# Patient Record
Sex: Female | Born: 1960 | Race: White | Hispanic: No | Marital: Married | Smoking: Former smoker
Health system: Southern US, Community
[De-identification: ages and names within clinical notes are randomized; demographics above are authoritative.]

## PROBLEM LIST (undated history)

## (undated) DIAGNOSIS — F32A Depression, unspecified: Secondary | ICD-10-CM

## (undated) DIAGNOSIS — F909 Attention-deficit hyperactivity disorder, unspecified type: Secondary | ICD-10-CM

## (undated) DIAGNOSIS — F329 Major depressive disorder, single episode, unspecified: Secondary | ICD-10-CM

## (undated) DIAGNOSIS — F419 Anxiety disorder, unspecified: Secondary | ICD-10-CM

## (undated) HISTORY — DX: Anxiety disorder, unspecified: F41.9

## (undated) HISTORY — DX: Major depressive disorder, single episode, unspecified: F32.9

## (undated) HISTORY — DX: Attention-deficit hyperactivity disorder, unspecified type: F90.9

## (undated) HISTORY — DX: Depression, unspecified: F32.A

---

## 2005-11-24 ENCOUNTER — Ambulatory Visit: Payer: Self-pay | Admitting: *Deleted

## 2005-11-24 ENCOUNTER — Ambulatory Visit (HOSPITAL_COMMUNITY): Payer: Self-pay | Admitting: *Deleted

## 2005-12-08 ENCOUNTER — Ambulatory Visit (HOSPITAL_COMMUNITY): Payer: Self-pay | Admitting: *Deleted

## 2006-01-26 ENCOUNTER — Ambulatory Visit (HOSPITAL_COMMUNITY): Payer: Self-pay | Admitting: *Deleted

## 2006-04-18 ENCOUNTER — Ambulatory Visit (HOSPITAL_COMMUNITY): Payer: Self-pay | Admitting: Psychiatry

## 2006-07-18 ENCOUNTER — Ambulatory Visit (HOSPITAL_COMMUNITY): Payer: Self-pay | Admitting: Psychiatry

## 2006-09-05 ENCOUNTER — Ambulatory Visit (HOSPITAL_COMMUNITY): Payer: Self-pay | Admitting: Psychiatry

## 2006-10-31 ENCOUNTER — Ambulatory Visit (HOSPITAL_COMMUNITY): Payer: Self-pay | Admitting: Psychiatry

## 2007-01-30 ENCOUNTER — Ambulatory Visit (HOSPITAL_COMMUNITY): Payer: Self-pay | Admitting: Psychiatry

## 2007-05-01 ENCOUNTER — Ambulatory Visit (HOSPITAL_COMMUNITY): Payer: Self-pay | Admitting: Psychiatry

## 2007-07-19 ENCOUNTER — Ambulatory Visit (HOSPITAL_COMMUNITY): Payer: Self-pay | Admitting: Psychiatry

## 2007-10-04 ENCOUNTER — Ambulatory Visit (HOSPITAL_COMMUNITY): Payer: Self-pay | Admitting: Psychiatry

## 2007-10-31 ENCOUNTER — Ambulatory Visit (HOSPITAL_COMMUNITY): Payer: Self-pay | Admitting: Psychiatry

## 2007-11-25 ENCOUNTER — Ambulatory Visit (HOSPITAL_COMMUNITY): Payer: Self-pay | Admitting: Psychiatry

## 2008-01-06 ENCOUNTER — Ambulatory Visit (HOSPITAL_COMMUNITY): Payer: Self-pay | Admitting: Psychiatry

## 2008-02-05 ENCOUNTER — Ambulatory Visit (HOSPITAL_COMMUNITY): Payer: Self-pay | Admitting: Psychiatry

## 2008-03-18 ENCOUNTER — Ambulatory Visit (HOSPITAL_COMMUNITY): Payer: Self-pay | Admitting: Psychiatry

## 2008-05-19 ENCOUNTER — Ambulatory Visit (HOSPITAL_COMMUNITY): Payer: Self-pay | Admitting: Psychiatry

## 2008-05-21 ENCOUNTER — Ambulatory Visit (HOSPITAL_COMMUNITY): Payer: Self-pay | Admitting: Psychiatry

## 2008-05-28 ENCOUNTER — Ambulatory Visit (HOSPITAL_COMMUNITY): Payer: Self-pay | Admitting: Psychiatry

## 2008-06-11 ENCOUNTER — Ambulatory Visit (HOSPITAL_COMMUNITY): Payer: Self-pay | Admitting: Psychiatry

## 2008-06-29 ENCOUNTER — Ambulatory Visit (HOSPITAL_COMMUNITY): Payer: Self-pay | Admitting: Psychiatry

## 2008-08-09 ENCOUNTER — Emergency Department (HOSPITAL_BASED_OUTPATIENT_CLINIC_OR_DEPARTMENT_OTHER): Admission: EM | Admit: 2008-08-09 | Discharge: 2008-08-09 | Payer: Self-pay | Admitting: Emergency Medicine

## 2008-09-08 ENCOUNTER — Ambulatory Visit (HOSPITAL_COMMUNITY): Payer: Self-pay | Admitting: Psychiatry

## 2008-10-27 ENCOUNTER — Ambulatory Visit (HOSPITAL_COMMUNITY): Payer: Self-pay | Admitting: Psychiatry

## 2008-12-08 ENCOUNTER — Ambulatory Visit (HOSPITAL_COMMUNITY): Payer: Self-pay | Admitting: Psychiatry

## 2009-02-03 ENCOUNTER — Ambulatory Visit (HOSPITAL_COMMUNITY): Payer: Self-pay | Admitting: Psychiatry

## 2009-04-28 ENCOUNTER — Ambulatory Visit (HOSPITAL_COMMUNITY): Payer: Self-pay | Admitting: Psychiatry

## 2009-08-10 ENCOUNTER — Ambulatory Visit (HOSPITAL_COMMUNITY): Payer: Self-pay | Admitting: Psychiatry

## 2009-08-10 IMAGING — CR DG ABDOMEN ACUTE W/ 1V CHEST
4 series · 4 of 4 positions shown · non-contrast
Comparison: None

CLINICAL DATA: Abdominal pain, nausea and vomiting

ACUTE ABDOMEN SERIES (ABDOMEN 2 VIEW & CHEST 1 VIEW)

[w chest pa (1 of 2)]
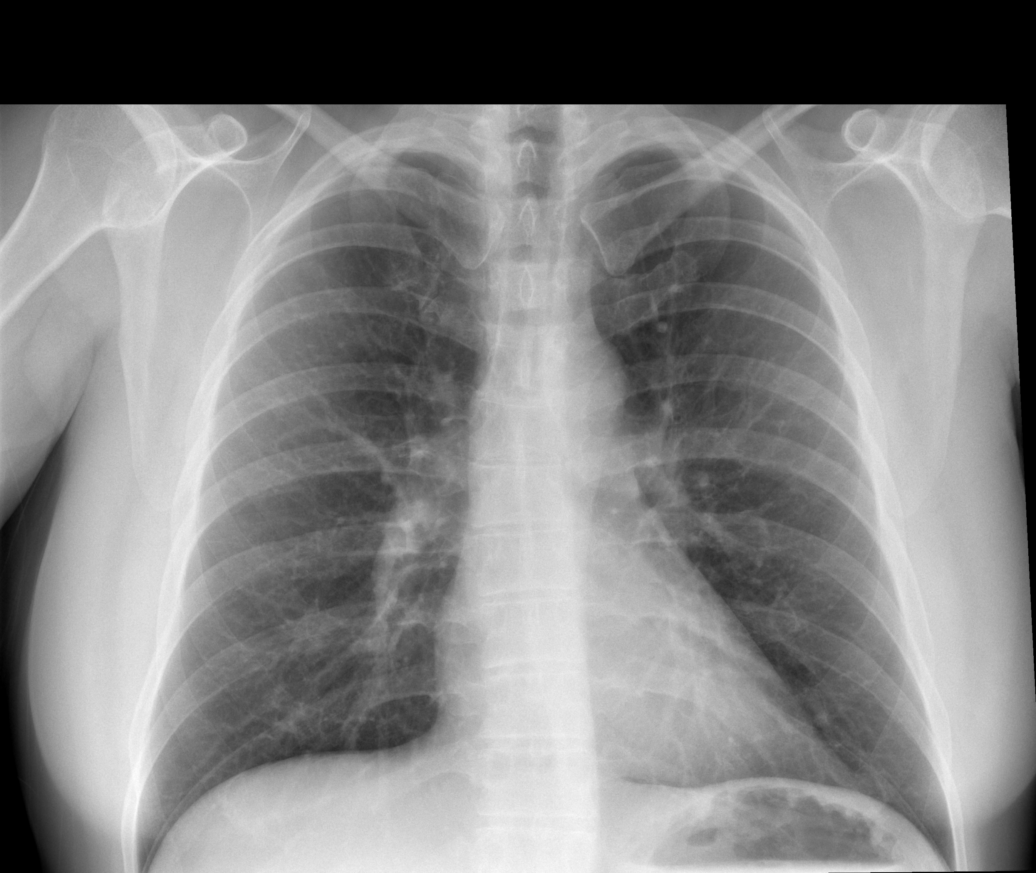

[w chest pa (2 of 2)]
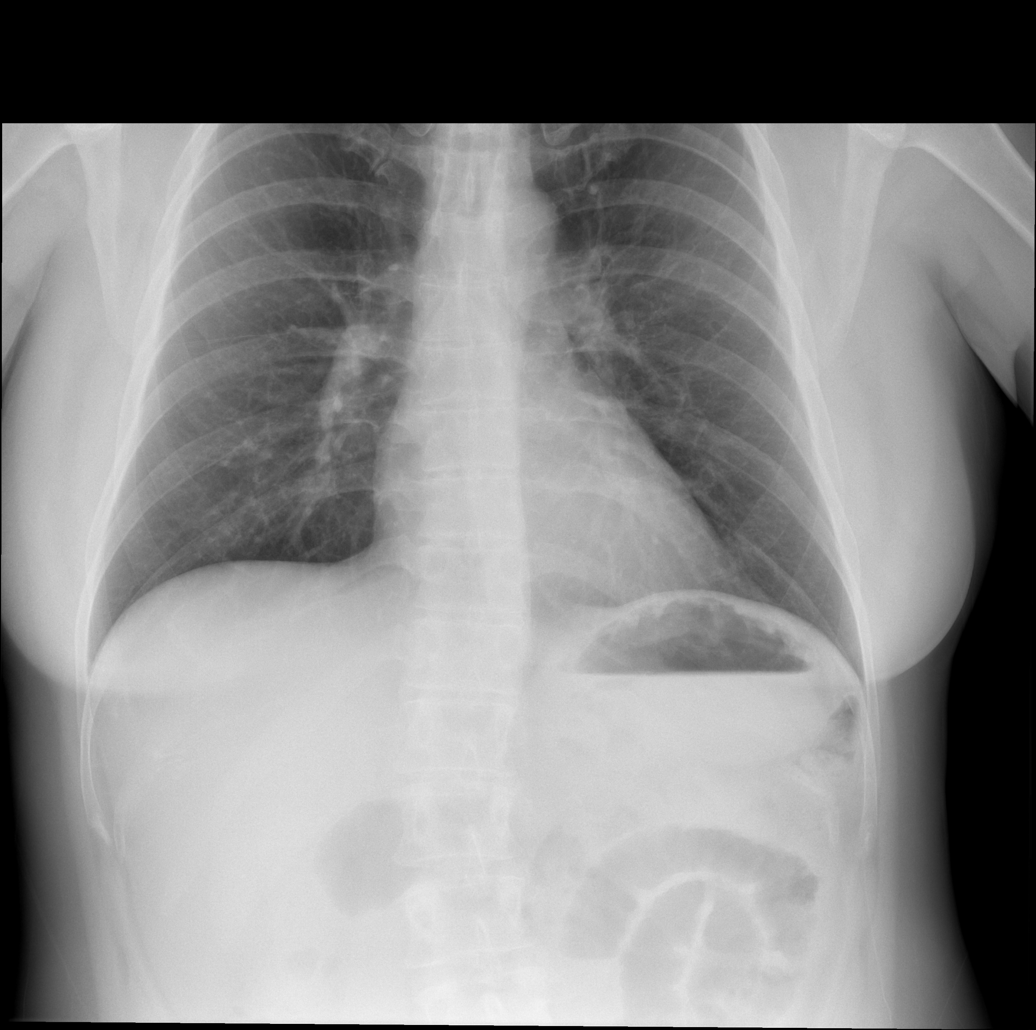

[w abdomen upright]
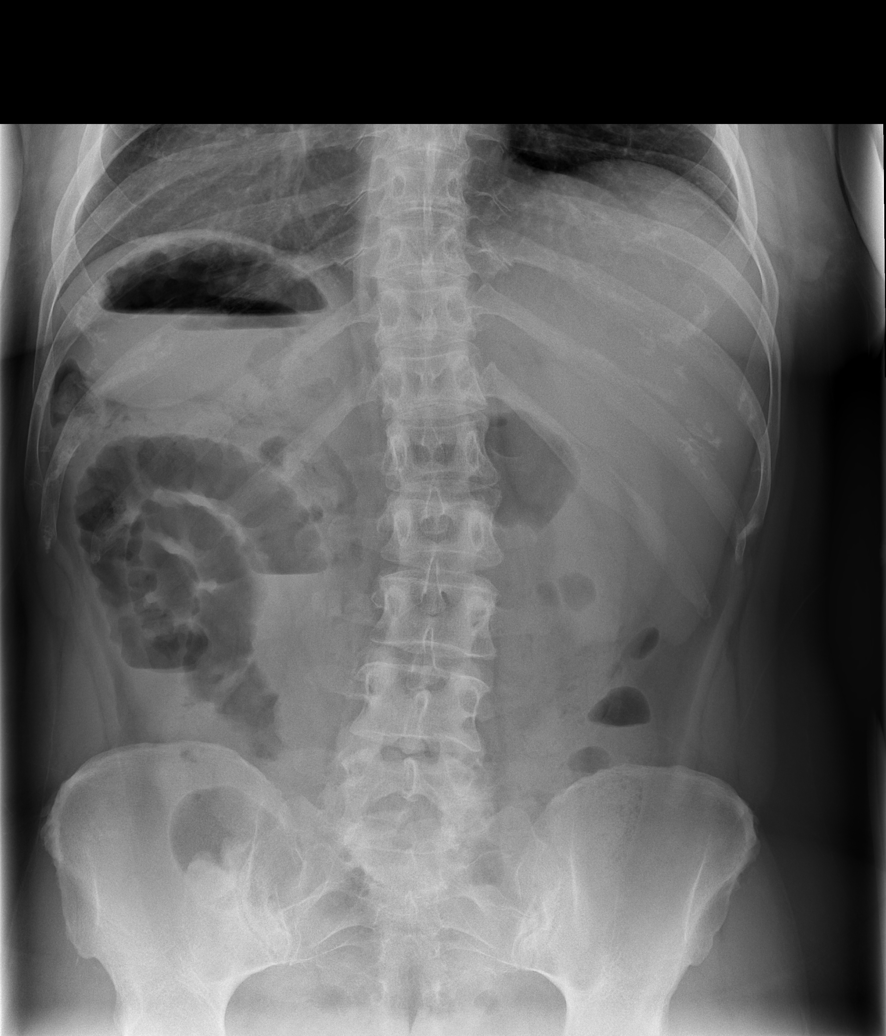

[t abdomen supine]
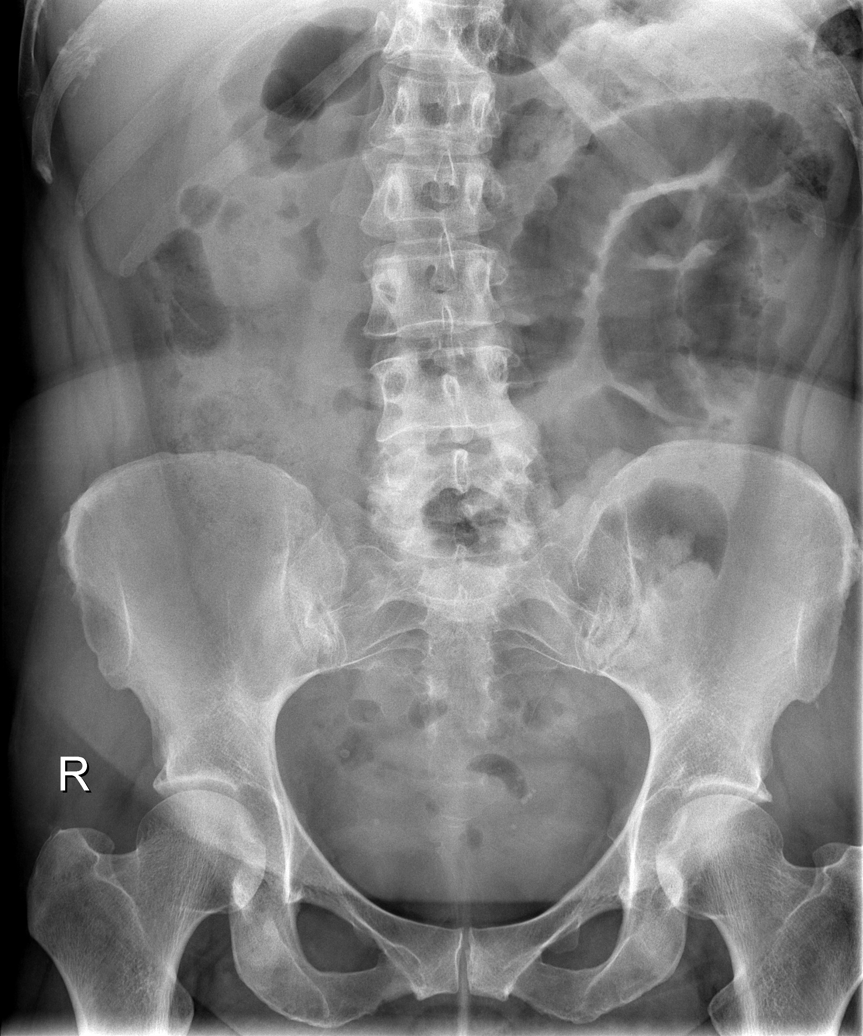

[4 of 4 positions shown; findings below may reference images not displayed]

FINDINGS: Heart size normal.  Lungs are clear.  No free air beneath
the diaphragms.

Several dilated loops of left upper quadrant mid small bowel are
noted.  Differential air-fluid levels are noted.  Distal colon is
decompressed.  Mild rightward curvature of the lumbar spine
centered at L2 is noted.  No abnormal calcific opacity.
IMPRESSION: No acute cardiopulmonary process.

Left upper quadrant mid small bowel dilatation with differential
air-fluid levels, consistent with mid small bowel obstruction or
focal ileus.

## 2009-10-13 ENCOUNTER — Ambulatory Visit (HOSPITAL_COMMUNITY): Payer: Self-pay | Admitting: Psychiatry

## 2009-12-15 ENCOUNTER — Ambulatory Visit (HOSPITAL_COMMUNITY): Payer: Self-pay | Admitting: Psychiatry

## 2010-03-31 ENCOUNTER — Ambulatory Visit (HOSPITAL_COMMUNITY): Payer: Self-pay | Admitting: Psychiatry

## 2010-07-12 ENCOUNTER — Ambulatory Visit (HOSPITAL_COMMUNITY): Payer: Self-pay | Admitting: Psychiatry

## 2010-10-11 ENCOUNTER — Ambulatory Visit (HOSPITAL_COMMUNITY): Payer: Self-pay | Admitting: Psychiatry

## 2011-01-11 ENCOUNTER — Ambulatory Visit (HOSPITAL_COMMUNITY)
Admission: RE | Admit: 2011-01-11 | Discharge: 2011-01-11 | Payer: Self-pay | Source: Home / Self Care | Attending: Psychiatry | Admitting: Psychiatry

## 2011-04-11 ENCOUNTER — Encounter (INDEPENDENT_AMBULATORY_CARE_PROVIDER_SITE_OTHER): Payer: Medicaid Other | Admitting: Psychiatry

## 2011-04-11 DIAGNOSIS — F909 Attention-deficit hyperactivity disorder, unspecified type: Secondary | ICD-10-CM

## 2011-04-11 DIAGNOSIS — F411 Generalized anxiety disorder: Secondary | ICD-10-CM

## 2011-06-12 ENCOUNTER — Encounter (INDEPENDENT_AMBULATORY_CARE_PROVIDER_SITE_OTHER): Payer: PRIVATE HEALTH INSURANCE | Admitting: Psychiatry

## 2011-06-12 DIAGNOSIS — F909 Attention-deficit hyperactivity disorder, unspecified type: Secondary | ICD-10-CM

## 2011-06-12 DIAGNOSIS — F339 Major depressive disorder, recurrent, unspecified: Secondary | ICD-10-CM

## 2011-09-11 ENCOUNTER — Encounter (HOSPITAL_COMMUNITY): Payer: PRIVATE HEALTH INSURANCE | Admitting: Psychiatry

## 2011-09-12 ENCOUNTER — Encounter (HOSPITAL_COMMUNITY): Payer: PRIVATE HEALTH INSURANCE | Admitting: Psychiatry

## 2011-09-20 ENCOUNTER — Encounter (INDEPENDENT_AMBULATORY_CARE_PROVIDER_SITE_OTHER): Payer: Medicaid Other | Admitting: Psychiatry

## 2011-09-20 DIAGNOSIS — F411 Generalized anxiety disorder: Secondary | ICD-10-CM

## 2011-09-20 DIAGNOSIS — F988 Other specified behavioral and emotional disorders with onset usually occurring in childhood and adolescence: Secondary | ICD-10-CM

## 2011-10-31 ENCOUNTER — Other Ambulatory Visit (HOSPITAL_COMMUNITY): Payer: Self-pay | Admitting: Psychiatry

## 2011-10-31 DIAGNOSIS — F411 Generalized anxiety disorder: Secondary | ICD-10-CM

## 2011-11-07 ENCOUNTER — Other Ambulatory Visit (HOSPITAL_COMMUNITY): Payer: Self-pay | Admitting: Psychiatry

## 2011-11-07 DIAGNOSIS — F411 Generalized anxiety disorder: Secondary | ICD-10-CM

## 2011-11-09 ENCOUNTER — Encounter (HOSPITAL_COMMUNITY): Payer: Self-pay

## 2011-12-22 ENCOUNTER — Encounter (HOSPITAL_COMMUNITY): Payer: Self-pay | Admitting: Psychiatry

## 2011-12-22 ENCOUNTER — Ambulatory Visit (INDEPENDENT_AMBULATORY_CARE_PROVIDER_SITE_OTHER): Payer: Private Health Insurance - Indemnity | Admitting: Psychiatry

## 2011-12-22 DIAGNOSIS — F329 Major depressive disorder, single episode, unspecified: Secondary | ICD-10-CM

## 2011-12-22 DIAGNOSIS — F909 Attention-deficit hyperactivity disorder, unspecified type: Secondary | ICD-10-CM

## 2011-12-22 DIAGNOSIS — F411 Generalized anxiety disorder: Secondary | ICD-10-CM

## 2011-12-22 MED ORDER — METHYLPHENIDATE HCL ER (OSM) 54 MG PO TBCR: 54.0000 mg | EXTENDED_RELEASE_TABLET | ORAL | Status: DC

## 2011-12-22 MED ORDER — METHYLPHENIDATE HCL ER (OSM) 54 MG PO TBCR: 54.0000 mg | EXTENDED_RELEASE_TABLET | Freq: Every day | ORAL | Status: DC

## 2011-12-22 MED ORDER — METHYLPHENIDATE HCL ER (OSM) 54 MG PO TBCR: 54.0000 mg | EXTENDED_RELEASE_TABLET | ORAL | Status: AC

## 2011-12-22 NOTE — Progress Notes (Signed)
   [facility] Follow-up Outpatient Visit  Valerie Pruitt Feb 02, 1961   Subjective: The patient is a 50 year old female who was followed by [facility] since April 2007 I have been treating her since August of 2008. She is currently diagnosed with major depressive disorder, generalized anxiety disorder, and ADHD. The patient reports and that she still having flatus of her chronic pancreatitis. She is doing better than she was approximately 6 months ago. She has gained back the weight that she has lost. She be a grandmother again. She is quite excited about this. She shows a family pictures of the extended family. She endorses good sleep and appetite. Her husband is now working second shift which is helping their relationship. She plans on starting to exercise for the new year. She reports that she's taking all her medications in the morning. She states that she gets a slump in the early afternoon. I wonder if this is the Concerta wearing off. She plans to start exercising during this slump to see if that makes a difference.  Filed Vitals:   12/22/11 1107  BP: 120/80    Mental Status Examination  Appearance: Casual Alert: Yes Attention: good  Cooperative: Yes Eye Contact: Good Speech: Regular rate rhythm and volume Psychomotor Activity: Normal Memory/Concentration: Intact Oriented: person, place, time/date and situation Mood: Euthymic Affect: Full Range Thought Processes and Associations: Logical Fund of Knowledge: Fair Thought Content: No suicidal or homicidal thoughts Insight: Fair Judgement: Fair  Diagnosis: Maj. depressive disorder, recurrent, moderate, generalized anxiety disorder, ADHD inattentive type  Treatment Plan: We will not do any med changes at this point. I will see the patient back in 3 months.  Jamse Mead, MD

## 2012-01-24 ENCOUNTER — Other Ambulatory Visit (HOSPITAL_COMMUNITY): Payer: Self-pay | Admitting: Psychiatry

## 2012-03-20 ENCOUNTER — Ambulatory Visit (INDEPENDENT_AMBULATORY_CARE_PROVIDER_SITE_OTHER): Payer: Private Health Insurance - Indemnity | Admitting: Psychiatry

## 2012-03-20 ENCOUNTER — Encounter (HOSPITAL_COMMUNITY): Payer: Self-pay | Admitting: Psychiatry

## 2012-03-20 VITALS — BP 152/91 | Ht 64.0 in | Wt 150.0 lb

## 2012-03-20 DIAGNOSIS — F329 Major depressive disorder, single episode, unspecified: Secondary | ICD-10-CM

## 2012-03-20 DIAGNOSIS — F909 Attention-deficit hyperactivity disorder, unspecified type: Secondary | ICD-10-CM

## 2012-03-20 DIAGNOSIS — F411 Generalized anxiety disorder: Secondary | ICD-10-CM

## 2012-03-20 MED ORDER — METHYLPHENIDATE HCL ER (OSM) 54 MG PO TBCR: 54.0000 mg | EXTENDED_RELEASE_TABLET | ORAL | Status: AC

## 2012-03-20 MED ORDER — FLUOXETINE HCL 20 MG PO CAPS: 80.0000 mg | ORAL_CAPSULE | Freq: Every day | ORAL | Status: DC

## 2012-03-20 NOTE — Progress Notes (Signed)
   [facility] Follow-up Outpatient Visit  Valerie Pruitt June 26, 1961   Subjective: The patient is a 51 year old female who was followed by [facility] since April 2007 I have been treating her since August of 2008. She is currently diagnosed with major depressive disorder, generalized anxiety disorder, and ADHD. The patient reports and that she still having flareups of her chronic pancreatitis. The patient presents today with more depressed mood.. Anniversary was last week. Her husband is nothing to celebrate. He's been sleeping in the afternoons when he gets home from work. He is very demanding and the mornings. She has started staying in bed to avoid him. She has been eating healthier. Today she is going to her son's house after the appointment to sign up to take GED courses on line. She is asking to go up on her Prozac.  Filed Vitals:   03/20/12 1228  BP: 152/91    Mental Status Examination  Appearance: Casual Alert: Yes Attention: good  Cooperative: Yes Eye Contact: Good Speech: Regular rate rhythm and volume Psychomotor Activity: Normal Memory/Concentration: Intact Oriented: person, place, time/date and situation Mood: Euthymic Affect: Full Range Thought Processes and Associations: Logical Fund of Knowledge: Fair Thought Content: No suicidal or homicidal thoughts Insight: Fair Judgement: Fair  Diagnosis: Maj. depressive disorder, recurrent, moderate, generalized anxiety disorder, ADHD inattentive type  Treatment Plan: We will increase Prozac to 80 mg daily. We will continue her other medications. I will see her back in 2 months.  Jamse Mead, MD

## 2012-03-21 ENCOUNTER — Ambulatory Visit (HOSPITAL_COMMUNITY): Payer: Self-pay | Admitting: Psychiatry

## 2012-04-16 ENCOUNTER — Other Ambulatory Visit (HOSPITAL_COMMUNITY): Payer: Self-pay | Admitting: Psychiatry

## 2012-05-21 ENCOUNTER — Ambulatory Visit (HOSPITAL_COMMUNITY): Payer: Self-pay | Admitting: Psychiatry

## 2012-05-24 ENCOUNTER — Encounter (HOSPITAL_COMMUNITY): Payer: Self-pay | Admitting: Psychiatry

## 2012-05-24 ENCOUNTER — Ambulatory Visit (INDEPENDENT_AMBULATORY_CARE_PROVIDER_SITE_OTHER): Payer: Private Health Insurance - Indemnity | Admitting: Psychiatry

## 2012-05-24 VITALS — BP 138/82 | Ht 64.0 in | Wt 141.0 lb

## 2012-05-24 DIAGNOSIS — F329 Major depressive disorder, single episode, unspecified: Secondary | ICD-10-CM

## 2012-05-24 DIAGNOSIS — F411 Generalized anxiety disorder: Secondary | ICD-10-CM

## 2012-05-24 DIAGNOSIS — F909 Attention-deficit hyperactivity disorder, unspecified type: Secondary | ICD-10-CM

## 2012-05-24 MED ORDER — METHYLPHENIDATE HCL ER (OSM) 54 MG PO TBCR: 54.0000 mg | EXTENDED_RELEASE_TABLET | Freq: Every day | ORAL | Status: DC

## 2012-05-24 MED ORDER — METHYLPHENIDATE HCL ER (OSM) 54 MG PO TBCR: 54.0000 mg | EXTENDED_RELEASE_TABLET | ORAL | Status: DC

## 2012-05-24 NOTE — Progress Notes (Signed)
   [facility] Follow-up Outpatient Visit  Valerie Pruitt 28-Nov-1961   Subjective: The patient is a 51 year old female who was followed by [facility] since April 2007 I have been treating her since August of 2008. She is currently diagnosed with major depressive disorder, generalized anxiety disorder, and ADHD. The patient reports and that she still having flareups of her chronic pancreatitis. At her last appointment, we increased Prozac to 80 mg daily. We continued the Xanax and Concerta. Patient is very tearful today. Her father's sickness has returned. Hospice is involved. Her husband as not being supportive. She feels like she's been pulled in multiple directions. She was just starting school when dad got more sick. She is with him every day, and frequently overnight. The patient is down 9 pounds today. She states that she tries to remember to eat, but can't forget. She sleeps when she can. She is very tearful. She is cry that we went up on the Prozac at last appointment, to because she feels it has helped.  Filed Vitals:   05/24/12 1110  BP: 138/82    Mental Status Examination  Appearance: Casual Alert: Yes Attention: good  Cooperative: Yes Eye Contact: Good Speech: Regular rate rhythm and volume Psychomotor Activity: Normal Memory/Concentration: Intact Oriented: person, place, time/date and situation Mood: Euthymic Affect: Full Range Thought Processes and Associations: Logical Fund of Knowledge: Fair Thought Content: No suicidal or homicidal thoughts Insight: Fair Judgement: Fair  Diagnosis: Maj. depressive disorder, recurrent, moderate, generalized anxiety disorder, ADHD inattentive type  Treatment Plan: We will not make any changes today. I will see her back in 2 months.  Jamse Mead, MD

## 2012-07-23 ENCOUNTER — Ambulatory Visit (HOSPITAL_COMMUNITY): Payer: Self-pay | Admitting: Psychiatry

## 2012-07-30 ENCOUNTER — Encounter (HOSPITAL_COMMUNITY): Payer: Self-pay | Admitting: Psychiatry

## 2012-07-30 ENCOUNTER — Ambulatory Visit (INDEPENDENT_AMBULATORY_CARE_PROVIDER_SITE_OTHER): Payer: Federal, State, Local not specified - Other | Admitting: Psychiatry

## 2012-07-30 VITALS — BP 138/82 | Ht 64.0 in | Wt 138.0 lb

## 2012-07-30 DIAGNOSIS — F329 Major depressive disorder, single episode, unspecified: Secondary | ICD-10-CM

## 2012-07-30 DIAGNOSIS — F331 Major depressive disorder, recurrent, moderate: Secondary | ICD-10-CM

## 2012-07-30 DIAGNOSIS — F909 Attention-deficit hyperactivity disorder, unspecified type: Secondary | ICD-10-CM

## 2012-07-30 DIAGNOSIS — F411 Generalized anxiety disorder: Secondary | ICD-10-CM

## 2012-07-30 DIAGNOSIS — F988 Other specified behavioral and emotional disorders with onset usually occurring in childhood and adolescence: Secondary | ICD-10-CM

## 2012-07-30 MED ORDER — METHYLPHENIDATE HCL ER (OSM) 54 MG PO TBCR: 54.0000 mg | EXTENDED_RELEASE_TABLET | ORAL | Status: DC

## 2012-07-30 NOTE — Progress Notes (Signed)
    [facility] Follow-up Outpatient Visit  Avalin Briley [birth year]   Subjective: The patient is a 52 year old female who was followed by [facility] since April 2007 I have been treating her since August of 2008. She is currently diagnosed with major depressive disorder, generalized anxiety disorder, and ADHD. The patient reports and that she still having flareups of her chronic pancreatitis. At her last appointment, we continued the Xanax , Prozac, and Concerta. Patient is still under a large amount of stress at home. Her husband has been trying to curtail his drinking. Her father is doing better with his health issues. There is been a lot of family drama. She feels that her daughter-in-law has a personality disorder and is causing issues. She is still planning on registering to complete her GED. Her son-in-law has been helping and encouraging her. She endorses good sleep and appetite. She feels that her anxiety is under control. It is just triggered by the family drama.  Filed Vitals:   07/30/12 1415  BP: 138/82    Mental Status Examination  Appearance: Casual Alert: Yes Attention: good  Cooperative: Yes Eye Contact: Good Speech: Regular rate rhythm and volume Psychomotor Activity: Normal Memory/Concentration: Intact Oriented: person, place, time/date and situation Mood: Euthymic Affect: Full Range Thought Processes and Associations: Logical Fund of Knowledge: Fair Thought Content: No suicidal or homicidal thoughts Insight: Fair Judgement: Fair  Diagnosis: Maj. depressive disorder, recurrent, moderate, generalized anxiety disorder, ADHD inattentive type  Treatment Plan: I will not make any changes today. I will see her back in 3 months.  Jamse Mead, MD

## 2012-10-09 ENCOUNTER — Ambulatory Visit (INDEPENDENT_AMBULATORY_CARE_PROVIDER_SITE_OTHER): Payer: Private Health Insurance - Indemnity | Admitting: Behavioral Health

## 2012-10-09 ENCOUNTER — Encounter (HOSPITAL_COMMUNITY): Payer: Self-pay | Admitting: Behavioral Health

## 2012-10-09 DIAGNOSIS — F411 Generalized anxiety disorder: Secondary | ICD-10-CM

## 2012-10-09 DIAGNOSIS — F339 Major depressive disorder, recurrent, unspecified: Secondary | ICD-10-CM
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
## 2012-10-16 ENCOUNTER — Ambulatory Visit (INDEPENDENT_AMBULATORY_CARE_PROVIDER_SITE_OTHER): Payer: Private Health Insurance - Indemnity | Admitting: Behavioral Health

## 2012-10-16 DIAGNOSIS — F411 Generalized anxiety disorder: Secondary | ICD-10-CM

## 2012-10-17 ENCOUNTER — Encounter (HOSPITAL_COMMUNITY): Payer: Self-pay | Admitting: Behavioral Health

## 2012-10-17 NOTE — Progress Notes (Signed)
   THERAPIST PROGRESS NOTE  Session Time: 11:00  Participation Level: Active  Behavioral Response: CasualAlertAnxious  Type of Therapy: Individual Therapy  Treatment Goals addressed: Coping  Interventions: CBT  Summary: Valerie Pruitt is a 51 y.o. female who presents with anxiety.   Suicidal/Homicidal: Nowithout intent/plan  Therapist Response: I promised the client a relaxation CD in the previous session but did not have any ready. I did give her a relaxation CD at the beginning of the session. She indicated that for the most part her son who was hospitalized just prior to the last session was home and was doing well. She indicated that he felt that she felt they had adjusted his medication and he was stable again. She did indicate that she is having a difficult time setting boundaries in terms of how much she wants to help him or how much she will allow her to help. She indicates that she will help anytime that she can with her grandson. We talked about how she could set healthy boundaries with her son and how she helps him as he reacclamate to being at home and going back to work. The client indicated that her biggest concern currently is caring for her elderly father who is in the beginning stages of Alzheimer's. She indicates that typically she speaks to her father on the phone daily and at times multiple times per day. She indicated that prior to her son being hospitalized she was seeing her father 3 days per week. Her father lives about 30 minutes away. She indicates that her brother who lives 5 minutes from her father has been critical recently of the amount of time that she has spent with her father. She indicates that he is basing what the father tells him and at the father's report is not accurate. She indicates that her pancreatic issues at times for her to stay in bed for days at a time because of her pancreas does spasm she is dealing with vomiting and diarrhea. She indicates that her  brother tells her that she just has to place mind over matter when she is dealing with that issue and does not understand the severity of her physical ailment. She indicated that now that her son is home she will be back in her routine of going 3 days a week and talking to her father daily. She indicates that her brother does go more often but he only stayed for a few minutes at a time where she spends hours and typically takes her father to all of his appointments and gives him baths. We talked at length about the conversation that she can have with her brother in terms of working out tearful her father. Her nephew also can go as well as her daughter. There is a hospice nurse he comes in one day a week. We talked about what the conversation with her brother would look like and how she could use I statements. We also talked about how they may just have to agreed to disagree in terms of how she reacts to the pancreatic issues in the physical affect they have on her.   Plan: Return again in 2 weeks.  Diagnosis: Axis I: 300.02    Axis II: Deferred    Gracen Ringwald M, LPC 10/17/2012

## 2012-10-23 ENCOUNTER — Ambulatory Visit (HOSPITAL_COMMUNITY): Payer: Self-pay | Admitting: Behavioral Health

## 2012-10-30 ENCOUNTER — Ambulatory Visit (INDEPENDENT_AMBULATORY_CARE_PROVIDER_SITE_OTHER): Payer: Private Health Insurance - Indemnity | Admitting: Psychiatry

## 2012-10-30 ENCOUNTER — Encounter (HOSPITAL_COMMUNITY): Payer: Self-pay | Admitting: Psychiatry

## 2012-10-30 VITALS — BP 148/90 | Ht 64.0 in | Wt 141.0 lb

## 2012-10-30 DIAGNOSIS — F411 Generalized anxiety disorder: Secondary | ICD-10-CM

## 2012-10-30 DIAGNOSIS — F900 Attention-deficit hyperactivity disorder, predominantly inattentive type: Secondary | ICD-10-CM

## 2012-10-30 DIAGNOSIS — F331 Major depressive disorder, recurrent, moderate: Secondary | ICD-10-CM

## 2012-10-30 DIAGNOSIS — F988 Other specified behavioral and emotional disorders with onset usually occurring in childhood and adolescence: Secondary | ICD-10-CM

## 2012-10-30 MED ORDER — METHYLPHENIDATE HCL ER (OSM) 54 MG PO TBCR: 54.0000 mg | EXTENDED_RELEASE_TABLET | ORAL | Status: DC

## 2012-10-30 MED ORDER — ALPRAZOLAM 1 MG PO TABS: 1.0000 mg | ORAL_TABLET | Freq: Four times a day (QID) | ORAL | Status: DC | PRN
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
## 2012-11-11 ENCOUNTER — Ambulatory Visit (HOSPITAL_COMMUNITY): Payer: Self-pay | Admitting: Behavioral Health

## 2012-12-13 ENCOUNTER — Other Ambulatory Visit (HOSPITAL_COMMUNITY): Payer: Self-pay | Admitting: Psychiatry

## 2012-12-24 ENCOUNTER — Other Ambulatory Visit (HOSPITAL_COMMUNITY): Payer: Self-pay | Admitting: Psychiatry

## 2012-12-24 MED ORDER — METHYLPHENIDATE HCL ER (OSM) 54 MG PO TBCR: 54.0000 mg | EXTENDED_RELEASE_TABLET | ORAL | Status: DC

## 2012-12-30 ENCOUNTER — Ambulatory Visit (HOSPITAL_COMMUNITY): Payer: Self-pay | Admitting: Psychiatry

## 2013-01-07 ENCOUNTER — Ambulatory Visit (HOSPITAL_COMMUNITY): Payer: Self-pay | Admitting: Psychiatry

## 2013-01-27 ENCOUNTER — Other Ambulatory Visit (HOSPITAL_COMMUNITY): Payer: Self-pay

## 2013-01-27 MED ORDER — METHYLPHENIDATE HCL ER (OSM) 54 MG PO TBCR: 54.0000 mg | EXTENDED_RELEASE_TABLET | ORAL | Status: DC

## 2013-01-27 NOTE — Telephone Encounter (Signed)
ok 

## 2013-01-30 ENCOUNTER — Ambulatory Visit (INDEPENDENT_AMBULATORY_CARE_PROVIDER_SITE_OTHER): Payer: Private Health Insurance - Indemnity | Admitting: Psychiatry

## 2013-01-30 ENCOUNTER — Encounter (HOSPITAL_COMMUNITY): Payer: Self-pay | Admitting: Psychiatry

## 2013-01-30 VITALS — BP 128/82 | Ht 64.0 in | Wt 148.0 lb

## 2013-01-30 DIAGNOSIS — F331 Major depressive disorder, recurrent, moderate: Secondary | ICD-10-CM

## 2013-01-30 DIAGNOSIS — F900 Attention-deficit hyperactivity disorder, predominantly inattentive type: Secondary | ICD-10-CM

## 2013-01-30 DIAGNOSIS — F988 Other specified behavioral and emotional disorders with onset usually occurring in childhood and adolescence: Secondary | ICD-10-CM

## 2013-01-30 DIAGNOSIS — F411 Generalized anxiety disorder: Secondary | ICD-10-CM

## 2013-01-30 MED ORDER — METHYLPHENIDATE HCL ER (OSM) 54 MG PO TBCR: 54.0000 mg | EXTENDED_RELEASE_TABLET | ORAL | Status: DC

## 2013-01-30 NOTE — Progress Notes (Signed)
   [facility] Follow-up Outpatient Visit  Valerie Pruitt 1961/04/25   Subjective: The patient is a 52 year old female who was followed by [facility] since April 2007 I have been treating her since August of 2008. She is currently diagnosed with major depressive disorder, generalized anxiety disorder, and ADHD. At her last appointment, I did not make any changes. She presents today. Since her last appointment, her father has died. He actually died a few days after our last appointment. The patient has been busy taking care of things. Her son who was hospitalized is doing much better. He used Prozac and Concerta for a short period of time. His depression to clear. Her husband has been very supportive during this time. The patient is not currently working on her GED. Her 76 year old son is now being treated for ADHD. The patient endorses good sleep and appetite. She has been working out. She has not had any flares of her pancreatitis. Her medications are working well. She has no other complaints today.  Filed Vitals:   01/30/13 1435  BP: 128/82    Mental Status Examination  Appearance: Casual Alert: Yes Attention: good  Cooperative: Yes Eye Contact: Good Speech: Regular rate rhythm and volume Psychomotor Activity: Normal Memory/Concentration: Intact Oriented: person, place, time/date and situation Mood: Euthymic Affect: Full Range Thought Processes and Associations: Logical Fund of Knowledge: Fair Thought Content: No suicidal or homicidal thoughts Insight: Fair Judgement: Fair  Diagnosis: Maj. depressive disorder, recurrent, moderate, generalized anxiety disorder, ADHD inattentive type  Treatment Plan: I will not make any changes today. I will see her back in 3 months.  Jamse Mead, MD

## 2013-04-14 ENCOUNTER — Other Ambulatory Visit (HOSPITAL_COMMUNITY): Payer: Self-pay | Admitting: Psychiatry

## 2013-04-29 ENCOUNTER — Ambulatory Visit (INDEPENDENT_AMBULATORY_CARE_PROVIDER_SITE_OTHER): Payer: Private Health Insurance - Indemnity | Admitting: Psychiatry

## 2013-04-29 ENCOUNTER — Encounter (HOSPITAL_COMMUNITY): Payer: Self-pay | Admitting: Psychiatry

## 2013-04-29 VITALS — BP 122/82 | Ht 64.0 in | Wt 149.0 lb

## 2013-04-29 DIAGNOSIS — F331 Major depressive disorder, recurrent, moderate: Secondary | ICD-10-CM

## 2013-04-29 DIAGNOSIS — F900 Attention-deficit hyperactivity disorder, predominantly inattentive type: Secondary | ICD-10-CM

## 2013-04-29 DIAGNOSIS — F411 Generalized anxiety disorder: Secondary | ICD-10-CM

## 2013-04-29 DIAGNOSIS — F988 Other specified behavioral and emotional disorders with onset usually occurring in childhood and adolescence: Secondary | ICD-10-CM

## 2013-04-29 MED ORDER — METHYLPHENIDATE HCL ER (OSM) 54 MG PO TBCR: 54.0000 mg | EXTENDED_RELEASE_TABLET | ORAL | Status: DC

## 2013-04-29 NOTE — Progress Notes (Signed)
[facility] Follow-up Outpatient Visit  Valerie Pruitt 12-15-1961   Subjective: The patient is a 52 year old female who was followed by [facility] since April 2007 I have been treating her since August of 2008. She is currently diagnosed with major depressive disorder, generalized anxiety disorder, and ADHD. At her last appointment, I did not make any changes. She presents today. The patient is concerned because her primary care physician has referred her to an oncologist. Her platelets were up in the 500s. She has not heard anything back yet. The patient has had some numbness in her left arm. There is no radiation. She does not think she's having a heart attack. When her father was ill, she was missing him a lot. She thinks of his stemming from that. She has had social issues with her older sister who lives up [place]. Her older sister is an alcoholic and a pill addict. She is trying to go after the patient's father's estate. She lied to the patient and told her she was dying of cancer. This cost issues between the 2 of them. The patient is trying to avoid her sister. She has not been working on her GED. Her husband currently has back issues and patient has been taking care of him. Patient endorses good sleep and appetite. She feels her anxiety is under control. She is having mild panic attacks in the evenings, but knows where they're stemming from. Focus and attention are good.  Filed Vitals:   04/29/13 1050  BP: 122/82   Active Ambulatory Problems    Diagnosis Date Noted  . GAD (generalized anxiety disorder) 12/22/2011  . ADHD (attention deficit hyperactivity disorder) 12/22/2011  . MDD (major depressive disorder) 12/22/2011   Resolved Ambulatory Problems    Diagnosis Date Noted  . No Resolved Ambulatory Problems   Past Medical History  Diagnosis Date  . Anxiety   . Depression    Current Outpatient Prescriptions on File Prior to Visit  Medication Sig  Dispense Refill  . ALPRAZolam (XANAX) 1 MG tablet take 1 tablet by mouth four times a day if needed for anxiety  120 tablet  5  . CREON 12000 UNITS CPEP       . diazepam (VALIUM) 5 MG tablet       . FLUoxetine (PROZAC) 20 MG capsule take 4 capsules by mouth once daily  120 capsule  5  . HYDROcodone-acetaminophen (VICODIN) 5-500 MG per tablet       . methylphenidate (CONCERTA) 54 MG CR tablet Take 1 tablet (54 mg total) by mouth every morning. Fill after 01/21/12  30 tablet  0  . methylphenidate (CONCERTA) 54 MG CR tablet Take 1 tablet (54 mg total) by mouth every morning.  30 tablet  0  . methylphenidate (CONCERTA) 54 MG CR tablet Take 1 tablet (54 mg total) by mouth every morning. Fill after 04/19/12  30 tablet  0  . methylphenidate (CONCERTA) 54 MG CR tablet Take 1 tablet (54 mg total) by mouth daily.  30 tablet  0  . methylphenidate (CONCERTA) 54 MG CR tablet Take 1 tablet (54 mg total) by mouth every morning. Fill after 06/23/12  30 tablet  0  . methylphenidate (CONCERTA) 54 MG CR tablet Take 1 tablet (54 mg total) by mouth every morning.  30 tablet  0  . methylphenidate (CONCERTA) 54 MG CR tablet Take 1 tablet (54 mg total) by mouth every morning. Fill after 08/29/12  30 tablet  0  . methylphenidate (  CONCERTA) 54 MG CR tablet Take 1 tablet (54 mg total) by mouth every morning. Fill after 09/28/12  30 tablet  0  . methylphenidate (CONCERTA) 54 MG CR tablet Take 1 tablet (54 mg total) by mouth every morning.  30 tablet  0  . methylphenidate (CONCERTA) 54 MG CR tablet Take 1 tablet (54 mg total) by mouth every morning. Fill after 11/29/12  30 tablet  0  . methylphenidate (CONCERTA) 54 MG CR tablet Take 1 tablet (54 mg total) by mouth every morning.  30 tablet  0  . methylphenidate (CONCERTA) 54 MG CR tablet Take 1 tablet (54 mg total) by mouth every morning.  30 tablet  0  . methylphenidate (CONCERTA) 54 MG CR tablet Take 1 tablet (54 mg total) by mouth every morning.  30 tablet  0  .  methylphenidate (CONCERTA) 54 MG CR tablet Take 1 tablet (54 mg total) by mouth every morning. Fill after 03/01/13  30 tablet  0  . methylphenidate (CONCERTA) 54 MG CR tablet Take 1 tablet (54 mg total) by mouth every morning. Fill after 03/31/13  30 tablet  0  . metroNIDAZOLE (METROCREAM) 0.75 % cream       . omeprazole (PRILOSEC) 20 MG capsule        No current facility-administered medications on file prior to visit.   Review of Systems - General ROS: negative for - sleep disturbance or weight loss Psychological ROS: negative for - anxiety or depression Cardiovascular ROS: no chest pain or dyspnea on exertion Musculoskeletal ROS: negative for - gait disturbance or muscular weakness Neurological ROS: negative for - headaches or seizures  Mental Status Examination  Appearance: Casual Alert: Yes Attention: good  Cooperative: Yes Eye Contact: Good Speech: Regular rate rhythm and volume Psychomotor Activity: Normal Memory/Concentration: Intact Oriented: person, place, time/date and situation Mood: Euthymic Affect: Full Range Thought Processes and Associations: Logical Fund of Knowledge: Fair Thought Content: No suicidal or homicidal thoughts Insight: Fair Judgement: Fair  Diagnosis: Maj. depressive disorder, recurrent, moderate, generalized anxiety disorder, ADHD inattentive type  Treatment Plan: I will not make any changes today. I will see her back in 3 months. Patient may call with concerns.  Jamse Mead, MD

## 2013-07-30 ENCOUNTER — Encounter (HOSPITAL_COMMUNITY): Payer: Self-pay | Admitting: Psychiatry

## 2013-07-30 ENCOUNTER — Ambulatory Visit (INDEPENDENT_AMBULATORY_CARE_PROVIDER_SITE_OTHER): Payer: Private Health Insurance - Indemnity | Admitting: Psychiatry

## 2013-07-30 VITALS — BP 134/80 | Ht 64.0 in | Wt 148.0 lb

## 2013-07-30 DIAGNOSIS — F411 Generalized anxiety disorder: Secondary | ICD-10-CM

## 2013-07-30 DIAGNOSIS — F988 Other specified behavioral and emotional disorders with onset usually occurring in childhood and adolescence: Secondary | ICD-10-CM

## 2013-07-30 DIAGNOSIS — F331 Major depressive disorder, recurrent, moderate: Secondary | ICD-10-CM

## 2013-07-30 DIAGNOSIS — F900 Attention-deficit hyperactivity disorder, predominantly inattentive type: Secondary | ICD-10-CM

## 2013-07-30 MED ORDER — METHYLPHENIDATE HCL ER (OSM) 54 MG PO TBCR: 54.0000 mg | EXTENDED_RELEASE_TABLET | ORAL | Status: DC

## 2013-07-30 MED ORDER — FLUOXETINE HCL 20 MG PO CAPS: ORAL_CAPSULE | ORAL | Status: DC
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
## 2013-09-10 ENCOUNTER — Encounter (HOSPITAL_COMMUNITY): Payer: Self-pay | Admitting: Psychiatry

## 2013-09-10 ENCOUNTER — Ambulatory Visit (INDEPENDENT_AMBULATORY_CARE_PROVIDER_SITE_OTHER): Payer: Private Health Insurance - Indemnity | Admitting: Psychiatry

## 2013-09-10 VITALS — BP 118/72 | Ht 64.0 in | Wt 148.0 lb

## 2013-09-10 DIAGNOSIS — F331 Major depressive disorder, recurrent, moderate: Secondary | ICD-10-CM

## 2013-09-10 DIAGNOSIS — F411 Generalized anxiety disorder: Secondary | ICD-10-CM

## 2013-09-10 DIAGNOSIS — F988 Other specified behavioral and emotional disorders with onset usually occurring in childhood and adolescence: Secondary | ICD-10-CM

## 2013-09-10 DIAGNOSIS — F900 Attention-deficit hyperactivity disorder, predominantly inattentive type: Secondary | ICD-10-CM

## 2013-09-10 MED ORDER — ALPRAZOLAM 1 MG PO TABS: ORAL_TABLET | ORAL | Status: AC

## 2013-09-10 MED ORDER — METHYLPHENIDATE HCL ER (OSM) 54 MG PO TBCR: 54.0000 mg | EXTENDED_RELEASE_TABLET | ORAL | Status: DC

## 2013-09-10 MED ORDER — METHYLPHENIDATE HCL ER (OSM) 54 MG PO TBCR
54.0000 mg | EXTENDED_RELEASE_TABLET | ORAL | Status: DC
Start: 2013-11-09 — End: 2014-02-02

## 2013-09-10 NOTE — Progress Notes (Signed)
[facility] Follow-up Outpatient Visit  Valerie Pruitt 1961-02-11   Subjective: The patient is a 52 year old female who was followed by [facility] since April 2007.  I have been treating her since August of 2008. She is currently diagnosed with major depressive disorder, generalized anxiety disorder, and ADHD.  She also had flares of chronic pancreatitis. I have spoken to her primary care who prescribes her Valium. The primary care notes that she is also on Xanax for anxiety. The Valium is only for a muscle relaxer when she is having the flares. At her last appointment, I increased her Prozac back to 40 mg daily, and continue her Concerta and Xanax. She presents today. Her daughter, son-in-law, and 2 grandchildren ages 59 and 39 are moving in temporarily. They will be converting the carport into an apartment. This is something the patient has wanted to do for a long time, and the son-in-law will pay for it. Her daughter and husband sold their condo unexpectedly. They do plan to build a house and moved out. The patient reports that starting school was her next plan staff. Her daughter will help her get set up on the computer. The patient has been avoiding her sister who is "crazy". The patient told her not to call anymore because all she does upset the patient. The patient does feel like she is doing better on the increased dose of Prozac. She is having less flashbacks and rumination. She is sleeping better. Appetite is good, and weight is the same. She has had no flares or pancreatitis. Overall she feels she is doing well.  Filed Vitals:   09/10/13 1045  BP: 118/72   Active Ambulatory Problems    Diagnosis Date Noted  . GAD (generalized anxiety disorder) 12/22/2011  . ADHD (attention deficit hyperactivity disorder) 12/22/2011  . MDD (major depressive disorder) 12/22/2011   Resolved Ambulatory Problems    Diagnosis Date Noted  . No Resolved Ambulatory Problems    Past Medical History  Diagnosis Date  . Anxiety   . Depression    Current Outpatient Prescriptions on File Prior to Visit  Medication Sig Dispense Refill  . CREON 12000 UNITS CPEP       . diazepam (VALIUM) 5 MG tablet       . FLUoxetine (PROZAC) 20 MG capsule take 4 capsules by mouth once daily  120 capsule  5  . HYDROcodone-acetaminophen (VICODIN) 5-500 MG per tablet       . methylphenidate (CONCERTA) 54 MG CR tablet Take 1 tablet (54 mg total) by mouth every morning. Fill after 01/21/12  30 tablet  0  . methylphenidate (CONCERTA) 54 MG CR tablet Take 1 tablet (54 mg total) by mouth every morning.  30 tablet  0  . methylphenidate (CONCERTA) 54 MG CR tablet Take 1 tablet (54 mg total) by mouth every morning. Fill after 04/19/12  30 tablet  0  . methylphenidate (CONCERTA) 54 MG CR tablet Take 1 tablet (54 mg total) by mouth daily.  30 tablet  0  . methylphenidate (CONCERTA) 54 MG CR tablet Take 1 tablet (54 mg total) by mouth every morning. Fill after 06/23/12  30 tablet  0  . methylphenidate (CONCERTA) 54 MG CR tablet Take 1 tablet (54 mg total) by mouth every morning.  30 tablet  0  . methylphenidate (CONCERTA) 54 MG CR tablet Take 1 tablet (54 mg total) by mouth every morning. Fill after 08/29/12  30 tablet  0  . methylphenidate (CONCERTA)  54 MG CR tablet Take 1 tablet (54 mg total) by mouth every morning. Fill after 09/28/12  30 tablet  0  . methylphenidate (CONCERTA) 54 MG CR tablet Take 1 tablet (54 mg total) by mouth every morning.  30 tablet  0  . methylphenidate (CONCERTA) 54 MG CR tablet Take 1 tablet (54 mg total) by mouth every morning. Fill after 11/29/12  30 tablet  0  . methylphenidate (CONCERTA) 54 MG CR tablet Take 1 tablet (54 mg total) by mouth every morning.  30 tablet  0  . methylphenidate (CONCERTA) 54 MG CR tablet Take 1 tablet (54 mg total) by mouth every morning.  30 tablet  0  . methylphenidate (CONCERTA) 54 MG CR tablet Take 1 tablet (54 mg total) by mouth every  morning.  30 tablet  0  . methylphenidate (CONCERTA) 54 MG CR tablet Take 1 tablet (54 mg total) by mouth every morning. Fill after 03/01/13  30 tablet  0  . methylphenidate (CONCERTA) 54 MG CR tablet Take 1 tablet (54 mg total) by mouth every morning. Fill after 03/31/13  30 tablet  0  . methylphenidate (CONCERTA) 54 MG CR tablet Take 1 tablet (54 mg total) by mouth every morning.  30 tablet  0  . methylphenidate (CONCERTA) 54 MG CR tablet Take 1 tablet (54 mg total) by mouth every morning. Fill after 05/29/13  30 tablet  0  . methylphenidate (CONCERTA) 54 MG CR tablet Take 1 tablet (54 mg total) by mouth every morning. Fill after 06/28/13  30 tablet  0  . methylphenidate (CONCERTA) 54 MG CR tablet Take 1 tablet (54 mg total) by mouth every morning.  30 tablet  0  . methylphenidate (CONCERTA) 54 MG CR tablet Take 1 tablet (54 mg total) by mouth every morning. Fill after 08/29/13  30 tablet  0  . metroNIDAZOLE (METROCREAM) 0.75 % cream       . omeprazole (PRILOSEC) 20 MG capsule        No current facility-administered medications on file prior to visit.   Review of Systems - General ROS: negative for - sleep disturbance or weight loss Psychological ROS: negative for - anxiety or depression Cardiovascular ROS: no chest pain or dyspnea on exertion Musculoskeletal ROS: negative for - gait disturbance or muscular weakness Neurological ROS: negative for - headaches or seizures  Mental Status Examination  Appearance: Casual Alert: Yes Attention: good  Cooperative: Yes Eye Contact: Good Speech: Regular rate rhythm and volume Psychomotor Activity: Normal Memory/Concentration: Intact Oriented: person, place, time/date and situation Mood: Euthymic Affect: Full Range Thought Processes and Associations: Logical Fund of Knowledge: Fair Thought Content: No suicidal or homicidal thoughts Insight: Fair Judgement: Fair  Diagnosis: Maj. depressive disorder, recurrent, moderate, generalized anxiety  disorder, ADHD inattentive type  Treatment Plan: I will not make any changes. I will continue Prozac 80 mg daily, Concerta 54 mg daily, and Xanax 1 mg 4 times a day as needed. I will see the patient back in 3 months. Patient may call with concerns.  Jamse Mead, MD

## 2013-09-26 ENCOUNTER — Telehealth (HOSPITAL_COMMUNITY): Payer: Self-pay

## 2013-09-26 NOTE — Telephone Encounter (Signed)
Returned call. LM

## 2013-09-26 NOTE — Telephone Encounter (Signed)
Son having issues with racing thoughts- changed meds and getting better.  Giving patient a hard time.  Patient walking away.

## 2013-09-29 ENCOUNTER — Ambulatory Visit (HOSPITAL_COMMUNITY): Payer: Self-pay | Admitting: Psychiatry

## 2013-10-30 ENCOUNTER — Other Ambulatory Visit: Payer: Self-pay

## 2013-11-04 ENCOUNTER — Ambulatory Visit (HOSPITAL_COMMUNITY): Payer: Self-pay | Admitting: Behavioral Health

## 2013-12-10 ENCOUNTER — Ambulatory Visit (HOSPITAL_COMMUNITY): Payer: Self-pay | Admitting: Psychiatry

## 2013-12-15 ENCOUNTER — Telehealth (HOSPITAL_COMMUNITY): Payer: Self-pay

## 2013-12-16 ENCOUNTER — Ambulatory Visit (HOSPITAL_COMMUNITY): Payer: Self-pay | Admitting: Psychiatry

## 2013-12-16 MED ORDER — METHYLPHENIDATE HCL ER (OSM) 54 MG PO TBCR: 54.0000 mg | EXTENDED_RELEASE_TABLET | Freq: Every day | ORAL | Status: DC

## 2013-12-16 NOTE — Telephone Encounter (Signed)
Refilled concerta. Will do UDS prior to next refill.

## 2013-12-23 ENCOUNTER — Encounter (HOSPITAL_COMMUNITY): Payer: Self-pay | Admitting: Behavioral Health

## 2014-01-28 ENCOUNTER — Ambulatory Visit (HOSPITAL_COMMUNITY): Payer: Self-pay | Admitting: Psychiatry

## 2014-01-30 ENCOUNTER — Telehealth (HOSPITAL_COMMUNITY): Payer: Self-pay | Admitting: Psychiatry

## 2014-01-30 ENCOUNTER — Ambulatory Visit (HOSPITAL_COMMUNITY): Payer: Self-pay | Admitting: Psychiatry

## 2014-01-30 DIAGNOSIS — F332 Major depressive disorder, recurrent severe without psychotic features: Secondary | ICD-10-CM

## 2014-01-30 DIAGNOSIS — F909 Attention-deficit hyperactivity disorder, unspecified type: Secondary | ICD-10-CM

## 2014-01-30 DIAGNOSIS — F411 Generalized anxiety disorder: Secondary | ICD-10-CM

## 2014-01-30 NOTE — Telephone Encounter (Signed)
Called patient. She will have to come to the clinic to fill her prescription of Concerta. She will have to have a drug screen prior to filling her medication. She has 2 refills of Xanax and a prescription of Valium from her PCP.

## 2014-02-02 ENCOUNTER — Telehealth (HOSPITAL_COMMUNITY): Payer: Self-pay | Admitting: Psychiatry

## 2014-02-02 MED ORDER — METHYLPHENIDATE HCL ER (OSM) 54 MG PO TBCR: 54.0000 mg | EXTENDED_RELEASE_TABLET | Freq: Every day | ORAL | Status: AC

## 2014-02-02 NOTE — Telephone Encounter (Signed)
Refilled concerta

## 2014-02-09 ENCOUNTER — Other Ambulatory Visit (HOSPITAL_COMMUNITY): Payer: Self-pay | Admitting: Psychiatry

## 2014-02-09 NOTE — Telephone Encounter (Signed)
Refilled fluoxetine.

## 2014-02-10 LAB — DRUG SCREEN PANEL (SERUM)

## 2014-02-18 ENCOUNTER — Telehealth (HOSPITAL_COMMUNITY): Payer: Self-pay | Admitting: Psychiatry

## 2014-02-18 NOTE — Telephone Encounter (Signed)
Patient has refills of both methylphenidate and alprazolam.

## 2014-02-19 ENCOUNTER — Ambulatory Visit (HOSPITAL_COMMUNITY): Payer: Self-pay | Admitting: Psychiatry
